# Patient Record
Sex: Male | Born: 1995
Health system: Southern US, Community
[De-identification: ages and names within clinical notes are randomized; demographics above are authoritative.]

## PROBLEM LIST (undated history)

## (undated) DIAGNOSIS — F1910 Other psychoactive substance abuse, uncomplicated: Secondary | ICD-10-CM

## (undated) DIAGNOSIS — Z789 Other specified health status: Secondary | ICD-10-CM

## (undated) HISTORY — PX: NO PAST SURGERIES: SHX2092

## (undated) HISTORY — PX: FACIAL FRACTURE SURGERY: SHX1570

## (undated) HISTORY — PX: RIB FRACTURE SURGERY: SHX2358

## (undated) HISTORY — PX: OTHER SURGICAL HISTORY: SHX169

---
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
## 2015-06-02 ENCOUNTER — Emergency Department (HOSPITAL_COMMUNITY)
Admission: EM | Admit: 2015-06-02 | Discharge: 2015-06-02 | Disposition: A | Discharge status: Other Acute Inpatient Hospital | Payer: 59 | Attending: Emergency Medicine | Admitting: Emergency Medicine

## 2015-06-02 ENCOUNTER — Encounter (HOSPITAL_COMMUNITY): Payer: Self-pay | Admitting: *Deleted

## 2015-06-02 DIAGNOSIS — F1210 Cannabis abuse, uncomplicated: Secondary | ICD-10-CM | POA: Diagnosis not present

## 2015-06-02 DIAGNOSIS — Z79899 Other long term (current) drug therapy: Secondary | ICD-10-CM | POA: Insufficient documentation

## 2015-06-02 DIAGNOSIS — F1310 Sedative, hypnotic or anxiolytic abuse, uncomplicated: Secondary | ICD-10-CM | POA: Insufficient documentation

## 2015-06-02 DIAGNOSIS — Z720 Tobacco use: Secondary | ICD-10-CM | POA: Insufficient documentation

## 2015-06-02 DIAGNOSIS — Z046 Encounter for general psychiatric examination, requested by authority: Secondary | ICD-10-CM | POA: Diagnosis present

## 2015-06-02 DIAGNOSIS — R45851 Suicidal ideations: Secondary | ICD-10-CM

## 2015-06-02 DIAGNOSIS — F1910 Other psychoactive substance abuse, uncomplicated: Secondary | ICD-10-CM

## 2015-06-02 LAB — RAPID URINE DRUG SCREEN, HOSP PERFORMED
AMPHETAMINES: NOT DETECTED
BARBITURATES: NOT DETECTED
Benzodiazepines: POSITIVE — AB
COCAINE: NOT DETECTED
Opiates: NOT DETECTED
TETRAHYDROCANNABINOL: POSITIVE — AB

## 2015-06-02 LAB — CBC
HCT: 44.6 % (ref 39.0–52.0)
Hemoglobin: 14.7 g/dL (ref 13.0–17.0)
MCH: 29.2 pg (ref 26.0–34.0)
MCHC: 33 g/dL (ref 30.0–36.0)
MCV: 88.5 fL (ref 78.0–100.0)
PLATELETS: 206 10*3/uL (ref 150–400)
RBC: 5.04 MIL/uL (ref 4.22–5.81)
RDW: 13 % (ref 11.5–15.5)
WBC: 8.3 10*3/uL (ref 4.0–10.5)

## 2015-06-02 LAB — ETHANOL

## 2015-06-02 LAB — COMPREHENSIVE METABOLIC PANEL
ALK PHOS: 64 U/L (ref 38–126)
ALT: 15 U/L — AB (ref 17–63)
AST: 20 U/L (ref 15–41)
Albumin: 4.4 g/dL (ref 3.5–5.0)
Anion gap: 6 (ref 5–15)
BILIRUBIN TOTAL: 0.6 mg/dL (ref 0.3–1.2)
BUN: 15 mg/dL (ref 6–20)
CO2: 25 mmol/L (ref 22–32)
CREATININE: 1.28 mg/dL — AB (ref 0.61–1.24)
Calcium: 9 mg/dL (ref 8.9–10.3)
Chloride: 108 mmol/L (ref 101–111)
GFR calc non Af Amer: >60 mL/min (ref >60)
Glucose, Bld: 103 mg/dL — ABNORMAL HIGH (ref 65–99)
POTASSIUM: 3.6 mmol/L (ref 3.5–5.1)
Sodium: 139 mmol/L (ref 135–145)
Total Protein: 7.1 g/dL (ref 6.5–8.1)

## 2015-06-02 MED ORDER — ZOLPIDEM TARTRATE 5 MG PO TABS: 5.0000 mg | ORAL_TABLET | Freq: Every evening | ORAL | Status: DC | PRN

## 2015-06-02 MED ORDER — IBUPROFEN 400 MG PO TABS: 600.0000 mg | ORAL_TABLET | Freq: Three times a day (TID) | ORAL | Status: DC | PRN

## 2015-06-02 MED ORDER — NICOTINE 21 MG/24HR TD PT24: 21.0000 mg | MEDICATED_PATCH | Freq: Every day | TRANSDERMAL | Status: DC

## 2015-06-02 MED ORDER — ACETAMINOPHEN 325 MG PO TABS: 650.0000 mg | ORAL_TABLET | ORAL | Status: DC | PRN

## 2015-06-02 MED ORDER — ONDANSETRON HCL 4 MG PO TABS: 4.0000 mg | ORAL_TABLET | Freq: Three times a day (TID) | ORAL | Status: DC | PRN
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
## 2015-06-02 NOTE — Progress Notes (Addendum)
Patient referred for psychiatric treatment at: [facility] - per intake, fax it. [facility] - per [facility], fax referral, low acuity beds open. [facility] - per intake, fax referral. [facility] - per [place], bed available but MD will review in am, fax it. [facility] - per intake, fax it. [facility] - per [facility] Sheldon, fax referral for review. [facility] - per intake, fax for review. [facility] - per intake, fax it for review.  At capacity: [facility]  CSW will continue to seek placement.  Melbourne Abts, LCSWA Disposition staff 06/02/2015 9:20 PM
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
## 2015-06-02 NOTE — ED Notes (Signed)
Patient ambulatory to and from restroom.

## 2015-06-02 NOTE — ED Notes (Signed)
Patient resting in bed at this time. Eyes closed. Even rise and fall of chest. NAD noted

## 2015-06-02 NOTE — Progress Notes (Signed)
Per [facility], patient accepted at [facility], to Dr. Dub Mikes, bed 302-1, patient to arrive tonight, 8/09.  Call report at [phone]. RN Jill Alexanders aware.  Melbourne Abts, LCSWA Disposition staff 06/02/2015 10:04 PM

## 2015-06-02 NOTE — Progress Notes (Addendum)
Patient accepted at [facility].   Melbourne Abts, LCSWA Disposition staff 06/02/2015 9:44 PM
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
## 2015-06-03 ENCOUNTER — Inpatient Hospital Stay (HOSPITAL_COMMUNITY)
Admission: EM | Admit: 2015-06-03 | Discharge: 2015-06-05 | DRG: 885 | Disposition: A | Discharge status: Home / Self Care | Payer: 59 | Source: Intra-hospital | Attending: Psychiatry | Admitting: Psychiatry

## 2015-06-03 ENCOUNTER — Encounter (HOSPITAL_COMMUNITY): Payer: Self-pay | Admitting: *Deleted

## 2015-06-03 DIAGNOSIS — F1910 Other psychoactive substance abuse, uncomplicated: Secondary | ICD-10-CM | POA: Diagnosis not present

## 2015-06-03 DIAGNOSIS — F17210 Nicotine dependence, cigarettes, uncomplicated: Secondary | ICD-10-CM | POA: Diagnosis present

## 2015-06-03 DIAGNOSIS — F329 Major depressive disorder, single episode, unspecified: Secondary | ICD-10-CM | POA: Diagnosis present

## 2015-06-03 DIAGNOSIS — F332 Major depressive disorder, recurrent severe without psychotic features: Secondary | ICD-10-CM | POA: Diagnosis present

## 2015-06-03 MED ORDER — TRAZODONE HCL 50 MG PO TABS
50.0000 mg | ORAL_TABLET | Freq: Every evening | ORAL | Status: DC | PRN
Start: 2015-06-03 — End: 2015-06-05
  Administered 2015-06-03 – 2015-06-04 (×3): 50 mg via ORAL
  Filled 2015-06-03: qty 1
  Filled 2015-06-03: qty 6
  Filled 2015-06-03 (×5): qty 1
  Filled 2015-06-03: qty 6

## 2015-06-03 MED ORDER — ALUM & MAG HYDROXIDE-SIMETH 200-200-20 MG/5ML PO SUSP
30.0000 mL | ORAL | Status: DC | PRN
Start: 2015-06-03 — End: 2015-06-05

## 2015-06-03 MED ORDER — IBUPROFEN 600 MG PO TABS
600.0000 mg | ORAL_TABLET | Freq: Four times a day (QID) | ORAL | Status: DC | PRN
  Administered 2015-06-03 – 2015-06-05 (×4): 600 mg via ORAL
  Filled 2015-06-03 (×4): qty 1

## 2015-06-03 MED ORDER — MAGNESIUM HYDROXIDE 400 MG/5ML PO SUSP: 30.0000 mL | Freq: Every day | ORAL | Status: DC | PRN

## 2015-06-03 MED ORDER — HYDROXYZINE HCL 50 MG PO TABS
50.0000 mg | ORAL_TABLET | Freq: Four times a day (QID) | ORAL | Status: DC | PRN
  Administered 2015-06-03: 50 mg via ORAL
  Filled 2015-06-03: qty 1
  Filled 2015-06-03: qty 12

## 2015-06-03 MED ORDER — NICOTINE 21 MG/24HR TD PT24
21.0000 mg | MEDICATED_PATCH | Freq: Every day | TRANSDERMAL | Status: DC
  Administered 2015-06-03 – 2015-06-05 (×3): 21 mg via TRANSDERMAL
  Filled 2015-06-03 (×5): qty 1

## 2015-06-03 MED ORDER — ACETAMINOPHEN 325 MG PO TABS
650.0000 mg | ORAL_TABLET | Freq: Four times a day (QID) | ORAL | Status: DC | PRN
  Administered 2015-06-03 – 2015-06-05 (×3): 650 mg via ORAL
  Filled 2015-06-03 (×3): qty 2
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
## 2015-06-03 NOTE — ED Notes (Signed)
Patient discharged to [facility] 

## 2015-06-03 NOTE — Tx Team (Signed)
Initial Interdisciplinary Treatment Plan   PATIENT STRESSORS: Legal issue Marital or family conflict Medication change or noncompliance Substance abuse   PATIENT STRENGTHS: Ability for insight Average or above average intelligence Communication skills Supportive family/friends Work skills   PROBLEM LIST: Problem List/Patient Goals Date to be addressed Date deferred Reason deferred Estimated date of resolution  "I was talking about ending it all, everybody has their time" 06-03-15     Depression 06-03-15     "I smoke weed" 06-03-15     "buy klonopin off the street" 06-03-15                                    DISCHARGE CRITERIA:  Improved stabilization in mood, thinking, and/or behavior Motivation to continue treatment in a less acute level of care Need for constant or close observation no longer present Reduction of life-threatening or endangering symptoms to within safe limits Verbal commitment to aftercare and medication compliance  PRELIMINARY DISCHARGE PLAN: Attend 12-step recovery group Outpatient therapy Participate in family therapy Return to previous work or school arrangements  PATIENT/FAMIILY INVOLVEMENT: This treatment plan has been presented to and reviewed with the patient, Stephen Rhodes, and/or family member.  The patient and family have been given the opportunity to ask questions and make suggestions.  Mickeal Needy 06/03/2015, 3:36 AM
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
## 2015-06-03 NOTE — Progress Notes (Signed)
D:  Per pt self inventory pt reports sleeping fair, appetite fair, energy level normal, ability to pay attention good, rates depression at a 3 out of 10, hopelessness at a 0 out of 10, anxiety at a 7 out of 10, denies SI/HI/AVH, goal today: "Just chill and work on being happy and not be a dickhead, work on being happy and nice", depressed/flat during interaction.     A:  Emotional support provided, Encouraged pt to continue with treatment plan and attend all group activities, q15 min checks maintained for safety.  R:  Pt is receptive, interacts appropriately, going to groups, pleasant and cooperative with staff and other patients on the unit.
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
## 2015-06-03 NOTE — Progress Notes (Signed)
Recreation Therapy Notes  Date: 08.10.16 Time: 930 am Location: 300 Hall Group Room  Group Topic: Stress Management  Goal Area(s) Addresses:  Patient will verbalize importance of using healthy stress management.  Patient will identify positive emotions associated with healthy stress management.   Behavioral Response:  Engaged  Intervention: Stress Management  Activity : Progressive Muscle Relaxation. LRT will introduce and instruct patients on the stress management technique of progressive muscle relaxation. Patients were asked to follow a long with a script read a loud by LRT to participate in the stress management technique of progressive muscle relaxation.  Education: Stress Management, Discharge Planning.   Education Outcome: Acknowledges edcuation/In group clarification offered/Needs additional education  Clinical Observations/Feedback: Patient attended the group.    Zack Crager, LRT/CTRS         Maxmillian Carsey A 06/03/2015 3:43 PM 

## 2015-06-03 NOTE — BHH Group Notes (Signed)
[facility] LCSW Aftercare Discharge Planning Group Note   06/03/2015 8:47 AM  Participation Quality:  DID NOT ATTEND. Chose to remain in room.  Smart, [employer]

## 2015-06-03 NOTE — BHH Group Notes (Signed)
[facility] LCSW Group Therapy  06/03/2015 1:15 PM  Type of Therapy:  Group Therapy  Participation Level:  Did Not Attend-pt chose to rest in room.   Summary of Progress/Problems: Emotion Regulation: This group focused on both positive and negative emotion identification and allowed group members to process ways to identify feelings, regulate negative emotions, and find healthy ways to manage internal/external emotions. Group members were asked to reflect on a time when their reaction to an emotion led to a negative outcome and explored how alternative responses using emotion regulation would have benefited them. Group members were also asked to discuss a time when emotion regulation was utilized when a negative emotion was experienced.   Smart, Artemio Dobie LCSWA  06/03/2015, 1:15 PM

## 2015-06-03 NOTE — Progress Notes (Signed)
Pt attended NA speaker meeting and was engaged and attentive.  

## 2015-06-04 NOTE — BHH Group Notes (Signed)
[facility] LCSW Group Therapy 06/04/2015 1:15 PM Type of Therapy: Group Therapy Participation Level: Active  Participation Quality: Attentive, Sharing and Supportive  Affect: Appropriate  Cognitive: Alert and Oriented  Insight: Developing/Improving and Engaged  Engagement in Therapy: Developing/Improving and Engaged  Modes of Intervention: Activity, Clarification, Confrontation, Discussion, Education, Exploration, Limit-setting, Orientation, Problem-solving, Rapport Building, Reality Testing, Socialization and Support  Summary of Progress/Problems: Patient was attentive and engaged with speaker from [employer]. Patient was attentive to speaker while they shared their story of dealing with mental health and overcoming it. Patient expressed interest in their programs and services and received information on their agency. Patient processed ways they can relate to the speaker.   Herma Uballe, MSW, LCSWA Clinical Social Worker [facility] [phone]   
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
## 2015-06-04 NOTE — Progress Notes (Signed)
Patient attended karaoke group and participated.  

## 2015-06-04 NOTE — [facility] Group Notes (Signed)
[facility] LCSW Group Therapy  06/04/2015 11:17 AM  Type of Therapy:  Group Therapy  Participation Level:  Did Not Attend-pt in bed sleeping. Chose not to attend group.   Summary of Progress/Problems:  Finding Balance in Life. Today's group focused on defining balance in one's own words, identifying things that can knock one off balance, and exploring healthy ways to maintain balance in life. Group members were asked to provide an example of a time when they felt off balance, describe how they handled that situation,and process healthier ways to regain balance in the future. Group members were asked to share the most important tool for maintaining balance that they learned while at [facility] and how they plan to apply this method after discharge.   Smart, Stephen Rhodes LCSWA  06/04/2015, 11:17 AM

## 2015-06-04 NOTE — Progress Notes (Signed)
D:  Per pt self inventory pt reports sleeping good, appetite fair, energy level normal, ability to pay attention good, rates depression at a 3 out of 10, hopelessness at a 4 out of 10, anxiety at a 5 out of 10, denies SI/HI/AVH, flat/anxious during interaction, goal today: "Just chill and get the hell out of here and keep my head up, stay positive"    A:  Emotional support provided, Encouraged pt to continue with treatment plan and attend all group activities, q15 min checks maintained for safety.  R:  Pt is receptive, going to groups, interacts appropriately, pleasant and cooperative with staff and other patients on the unit.
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
## 2015-06-04 NOTE — [facility] Suicide Risk Assessment (Signed)
[facility] INPATIENT:  Family/Significant Other Suicide Prevention Education  Suicide Prevention Education:  Contact Attempts: Taevyn Hausen (pt's mother) [phone] has been identified by the patient as the family member/significant other with whom the patient will be residing, and identified as the person(s) who will aid the patient in the event of a mental health crisis.  With written consent from the patient, two attempts were made to provide suicide prevention education, prior to and/or following the patient's discharge.  We were unsuccessful in providing suicide prevention education.  A suicide education pamphlet was given to the patient to share with family/significant other.  Date and time of first attempt: 8/1//16 at 10:40AM. Voicemail left requesting call back at her earliest convenience.   Smart, Tinlee Navarrette LCSWA  06/04/2015, 10:41 AM   SPE completed with Marisue Ivan. She voiced no concerns regarding pt returning home tomorrow and was informed of pt's aftercare plan. No guns/firearms in home.   Trula Slade, LCSWA Clinical Social Worker 06/04/2015 11:27 AM

## 2015-06-05 DIAGNOSIS — F332 Major depressive disorder, recurrent severe without psychotic features: Secondary | ICD-10-CM | POA: Insufficient documentation

## 2015-06-05 MED ORDER — HYDROXYZINE HCL 50 MG PO TABS: 50.0000 mg | ORAL_TABLET | Freq: Four times a day (QID) | ORAL | Status: AC | PRN

## 2015-06-05 MED ORDER — NICOTINE 21 MG/24HR TD PT24: 21.0000 mg | MEDICATED_PATCH | Freq: Every day | TRANSDERMAL | Status: AC

## 2015-06-05 MED ORDER — TRAZODONE HCL 50 MG PO TABS: 50.0000 mg | ORAL_TABLET | Freq: Every evening | ORAL | Status: AC | PRN

## 2015-06-05 NOTE — Progress Notes (Signed)
Pt came back from being outside and reported that he grabbed the basket playing basketball and hurt his rt wrist. No redness or swelling noted. Pt moving wrist back and forth. Pt had been given ibuprofen and tylenol prior to going outside. Gave ice pack and reported to NP. Safety maintained.

## 2015-06-05 NOTE — Progress Notes (Signed)
Pt reports he is doing well this evening.  He had a visit with his mother tonight.  He says that he plans to move in with her after his father leaves the home.  He says he will stay with friends until that time.  He also states that he and is sister are going together to some of the recruitment centers to see which branch of the military they are interested in as his sister wants to join the [employer] also.  He seems to determined to change the direction of his life and Clinical research associate commended him for his motivation.  He reports he attended groups today and found them helpful.  He denies SI/HI/AVH.  He makes his needs known to staff.  He is med compliant.  He denies any withdrawal symptoms.  Pt is pleasant and cooperative.  Support and encouragement offered.  Safety maintained with q15 minute checks.

## 2015-06-05 NOTE — Progress Notes (Signed)
  [facility] Adult Case Management Discharge Plan :  Will you be returning to the same living situation after discharge:  Yes,  home with mother At discharge, do you have transportation home?: Yes,  pt's mother coming after lunch to pick up pt. Do you have the ability to pay for your medications: Yes,  [facility]  Release of information consent forms completed and submitted to Medical Records by CSW.  Patient to Follow up at: Follow-up Information    Follow up with [facility]  On 06/20/2015.   Why:  Appt on this date with Dr. Michae Kava at [facility] for medication management.   Contact information:   [address] [place], [place] [address] Phone: [phone] Fax: [phone]      Follow up with [facility]  On 06/30/2015.   Why:  Arrive at 9:15AM to complete New Patient Packet. Therapy appt scheduled for 10:00AM. Please call within 48 hours of appt to cancel or reschedule.    Contact information:   [address] [place], [place] [address] Phone: [phone] Fax: [phone]      Patient denies SI/HI: Yes,  during group/self report.     Safety Planning and Suicide Prevention discussed: Yes,  SPE completed with pt's mother. SPI pamphlet provided to pt.   Have you used any form of tobacco in the last 30 days? (Cigarettes, Smokeless Tobacco, Cigars, and/or Pipes): Yes  Has patient been referred to the Quitline?: Yes, faxed on 06/05/15  Smart, Shaughn Thomley LCSWA  06/05/2015, 9:56 AM

## 2015-06-05 NOTE — Progress Notes (Signed)
Discharge note:  Patient discharged home per MD order.  Patient received all personal belongings, prescriptions and medication samples.  Reviewed discharge instructions, along with follow up appointments.  Patient indicated understanding.  He denies SI/HI/AVH.  Patient left ambulatory with his mother.

## 2015-06-05 NOTE — Progress Notes (Signed)
Recreation Therapy Notes  Date: 08.12.16 Time: 9:30 am Location: [address] Group Room  Group Topic: Stress Management  Goal Area(s) Addresses:  Patient will verbalize importance of using healthy stress management.  Patient will identify positive emotions associated with healthy stress management.   Intervention: Stress Management  Activity :  Guided Training and development officer.  LRT will introduce and educate the patients on the stress management technique of guided imagery.  A script was used to deliver the technique to the patients.  Patients were asked to follow the script read aloud by the LRT to engage in the stress management technique.  Education:  Stress Management, Discharge Planning.   Education Outcome: Acknowledges edcuation/In group clarification offered/Needs additional education  Clinical Observations/Feedback: Patient did not attend group.   Caroll Rancher, LRT/CTRS         Caroll Rancher A 06/05/2015 4:01 PM
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
## 2015-06-05 NOTE — [facility] Group Notes (Signed)
[facility] LCSW Aftercare Discharge Planning Group Note   06/05/2015 9:52 AM  Participation Quality:  Appropriate   Mood/Affect:  Appropriate  Depression Rating:  0  Anxiety Rating:  0  Thoughts of Suicide:  No Will you contract for safety?   NA  Current AVH:  No  Plan for Discharge/Comments:  Pt reports that he had a good visit with his mother last night. Pt ready to d/c today. No withdrawal sx and pt aware of therapy/med management appts made through [facility]. Pt to return home with his family and plans to join the [employer] "as soon as I can."   [employer]: mother coming after lunch today.   Supports: mother, friends, sister   Smart, Lebron Quam
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
## 2015-06-05 NOTE — [facility] Group Notes (Signed)
[facility] Group Notes:  (Nursing/MHT/Case Management/Adjunct)  Date:  06/04/2015 LATE ENTRY  Time:  0900 Type of Therapy:  Nurse Education  : the group is focused on helping patients identify unhealthy behaviors.  Participation Level:  Active  Participation Quality:  Appropriate  Affect:  Anxious  Cognitive:  Alert  Insight:  Limited  Engagement in Group:  Engaged  Modes of Intervention:  Education  Summary of Progress/Problems:  Stephen Rhodes 06/05/2015, 10:28 AM
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
## 2015-06-05 NOTE — [facility] Group Notes (Signed)
[facility] Group Notes:  (Nursing/MHT/Case Management/Adjunct)  Date: 06/04/2015  LATE ENTRY   Time: 0900   Type of Therapy:  Nurse Education : the group is focused on helping patients identify unhealthy behaviors.  Participation Level:  Active  Participation Quality:  Appropriate  Affect:  Appropriate  Cognitive:  Alert  Insight:  Lacking  Engagement in Group:  Lacking  Modes of Intervention:  Education  Summary of Progress/Problems:  Stephen Rhodes 06/05/2015, 10:14 AM

## 2015-06-30 ENCOUNTER — Ambulatory Visit (HOSPITAL_COMMUNITY): Payer: Self-pay | Admitting: Psychology

## 2015-07-21 ENCOUNTER — Ambulatory Visit (HOSPITAL_COMMUNITY): Payer: Self-pay | Admitting: Psychiatry

## 2015-10-28 DIAGNOSIS — F1920 Other psychoactive substance dependence, uncomplicated: Secondary | ICD-10-CM | POA: Diagnosis not present

## 2015-10-28 DIAGNOSIS — F1399 Sedative, hypnotic or anxiolytic use, unspecified with unspecified sedative, hypnotic or anxiolytic-induced disorder: Secondary | ICD-10-CM | POA: Diagnosis not present

## 2015-10-28 DIAGNOSIS — F1299 Cannabis use, unspecified with unspecified cannabis-induced disorder: Secondary | ICD-10-CM | POA: Diagnosis not present

## 2015-10-28 DIAGNOSIS — F329 Major depressive disorder, single episode, unspecified: Secondary | ICD-10-CM | POA: Diagnosis not present

## 2015-10-28 DIAGNOSIS — F1599 Other stimulant use, unspecified with unspecified stimulant-induced disorder: Secondary | ICD-10-CM | POA: Diagnosis not present

## 2015-10-29 DIAGNOSIS — F1599 Other stimulant use, unspecified with unspecified stimulant-induced disorder: Secondary | ICD-10-CM | POA: Diagnosis not present

## 2015-10-29 DIAGNOSIS — F1399 Sedative, hypnotic or anxiolytic use, unspecified with unspecified sedative, hypnotic or anxiolytic-induced disorder: Secondary | ICD-10-CM | POA: Diagnosis not present

## 2015-10-29 DIAGNOSIS — F329 Major depressive disorder, single episode, unspecified: Secondary | ICD-10-CM | POA: Diagnosis not present

## 2015-10-29 DIAGNOSIS — F1920 Other psychoactive substance dependence, uncomplicated: Secondary | ICD-10-CM | POA: Diagnosis not present

## 2015-10-29 DIAGNOSIS — F1299 Cannabis use, unspecified with unspecified cannabis-induced disorder: Secondary | ICD-10-CM | POA: Diagnosis not present

## 2015-11-03 DIAGNOSIS — F1599 Other stimulant use, unspecified with unspecified stimulant-induced disorder: Secondary | ICD-10-CM | POA: Diagnosis not present

## 2015-11-03 DIAGNOSIS — F329 Major depressive disorder, single episode, unspecified: Secondary | ICD-10-CM | POA: Diagnosis not present

## 2015-11-03 DIAGNOSIS — F1299 Cannabis use, unspecified with unspecified cannabis-induced disorder: Secondary | ICD-10-CM | POA: Diagnosis not present

## 2015-11-03 DIAGNOSIS — F1399 Sedative, hypnotic or anxiolytic use, unspecified with unspecified sedative, hypnotic or anxiolytic-induced disorder: Secondary | ICD-10-CM | POA: Diagnosis not present

## 2015-11-03 DIAGNOSIS — F1920 Other psychoactive substance dependence, uncomplicated: Secondary | ICD-10-CM | POA: Diagnosis not present

## 2015-11-05 DIAGNOSIS — F1399 Sedative, hypnotic or anxiolytic use, unspecified with unspecified sedative, hypnotic or anxiolytic-induced disorder: Secondary | ICD-10-CM | POA: Diagnosis not present

## 2015-11-05 DIAGNOSIS — F1920 Other psychoactive substance dependence, uncomplicated: Secondary | ICD-10-CM | POA: Diagnosis not present

## 2015-11-05 DIAGNOSIS — F1599 Other stimulant use, unspecified with unspecified stimulant-induced disorder: Secondary | ICD-10-CM | POA: Diagnosis not present

## 2015-11-05 DIAGNOSIS — F1299 Cannabis use, unspecified with unspecified cannabis-induced disorder: Secondary | ICD-10-CM | POA: Diagnosis not present

## 2015-11-05 DIAGNOSIS — F329 Major depressive disorder, single episode, unspecified: Secondary | ICD-10-CM | POA: Diagnosis not present

## 2015-11-06 DIAGNOSIS — F1399 Sedative, hypnotic or anxiolytic use, unspecified with unspecified sedative, hypnotic or anxiolytic-induced disorder: Secondary | ICD-10-CM | POA: Diagnosis not present

## 2015-11-07 DIAGNOSIS — F1399 Sedative, hypnotic or anxiolytic use, unspecified with unspecified sedative, hypnotic or anxiolytic-induced disorder: Secondary | ICD-10-CM | POA: Diagnosis not present

## 2015-11-08 DIAGNOSIS — F1399 Sedative, hypnotic or anxiolytic use, unspecified with unspecified sedative, hypnotic or anxiolytic-induced disorder: Secondary | ICD-10-CM | POA: Diagnosis not present

## 2015-11-09 DIAGNOSIS — F1399 Sedative, hypnotic or anxiolytic use, unspecified with unspecified sedative, hypnotic or anxiolytic-induced disorder: Secondary | ICD-10-CM | POA: Diagnosis not present

## 2015-11-10 DIAGNOSIS — F1399 Sedative, hypnotic or anxiolytic use, unspecified with unspecified sedative, hypnotic or anxiolytic-induced disorder: Secondary | ICD-10-CM | POA: Diagnosis not present

## 2015-11-11 DIAGNOSIS — F1399 Sedative, hypnotic or anxiolytic use, unspecified with unspecified sedative, hypnotic or anxiolytic-induced disorder: Secondary | ICD-10-CM | POA: Diagnosis not present

## 2015-11-12 DIAGNOSIS — F1399 Sedative, hypnotic or anxiolytic use, unspecified with unspecified sedative, hypnotic or anxiolytic-induced disorder: Secondary | ICD-10-CM | POA: Diagnosis not present

## 2015-11-12 DIAGNOSIS — F1599 Other stimulant use, unspecified with unspecified stimulant-induced disorder: Secondary | ICD-10-CM | POA: Diagnosis not present

## 2015-11-12 DIAGNOSIS — F1920 Other psychoactive substance dependence, uncomplicated: Secondary | ICD-10-CM | POA: Diagnosis not present

## 2015-11-12 DIAGNOSIS — F329 Major depressive disorder, single episode, unspecified: Secondary | ICD-10-CM | POA: Diagnosis not present

## 2015-11-12 DIAGNOSIS — F1299 Cannabis use, unspecified with unspecified cannabis-induced disorder: Secondary | ICD-10-CM | POA: Diagnosis not present

## 2015-11-13 DIAGNOSIS — F1399 Sedative, hypnotic or anxiolytic use, unspecified with unspecified sedative, hypnotic or anxiolytic-induced disorder: Secondary | ICD-10-CM | POA: Diagnosis not present

## 2015-11-14 DIAGNOSIS — F1399 Sedative, hypnotic or anxiolytic use, unspecified with unspecified sedative, hypnotic or anxiolytic-induced disorder: Secondary | ICD-10-CM | POA: Diagnosis not present

## 2015-11-15 DIAGNOSIS — F1399 Sedative, hypnotic or anxiolytic use, unspecified with unspecified sedative, hypnotic or anxiolytic-induced disorder: Secondary | ICD-10-CM | POA: Diagnosis not present

## 2015-11-16 DIAGNOSIS — F1399 Sedative, hypnotic or anxiolytic use, unspecified with unspecified sedative, hypnotic or anxiolytic-induced disorder: Secondary | ICD-10-CM | POA: Diagnosis not present

## 2015-11-17 DIAGNOSIS — F329 Major depressive disorder, single episode, unspecified: Secondary | ICD-10-CM | POA: Diagnosis not present

## 2015-11-17 DIAGNOSIS — F1399 Sedative, hypnotic or anxiolytic use, unspecified with unspecified sedative, hypnotic or anxiolytic-induced disorder: Secondary | ICD-10-CM | POA: Diagnosis not present

## 2015-11-17 DIAGNOSIS — F1599 Other stimulant use, unspecified with unspecified stimulant-induced disorder: Secondary | ICD-10-CM | POA: Diagnosis not present

## 2015-11-17 DIAGNOSIS — F1920 Other psychoactive substance dependence, uncomplicated: Secondary | ICD-10-CM | POA: Diagnosis not present

## 2015-11-17 DIAGNOSIS — F1299 Cannabis use, unspecified with unspecified cannabis-induced disorder: Secondary | ICD-10-CM | POA: Diagnosis not present

## 2015-11-19 DIAGNOSIS — F1399 Sedative, hypnotic or anxiolytic use, unspecified with unspecified sedative, hypnotic or anxiolytic-induced disorder: Secondary | ICD-10-CM | POA: Diagnosis not present

## 2015-11-23 DIAGNOSIS — F1399 Sedative, hypnotic or anxiolytic use, unspecified with unspecified sedative, hypnotic or anxiolytic-induced disorder: Secondary | ICD-10-CM | POA: Diagnosis not present

## 2015-11-24 DIAGNOSIS — F1599 Other stimulant use, unspecified with unspecified stimulant-induced disorder: Secondary | ICD-10-CM | POA: Diagnosis not present

## 2015-11-24 DIAGNOSIS — F1299 Cannabis use, unspecified with unspecified cannabis-induced disorder: Secondary | ICD-10-CM | POA: Diagnosis not present

## 2015-11-24 DIAGNOSIS — F1399 Sedative, hypnotic or anxiolytic use, unspecified with unspecified sedative, hypnotic or anxiolytic-induced disorder: Secondary | ICD-10-CM | POA: Diagnosis not present

## 2015-11-24 DIAGNOSIS — F1920 Other psychoactive substance dependence, uncomplicated: Secondary | ICD-10-CM | POA: Diagnosis not present

## 2015-11-24 DIAGNOSIS — F329 Major depressive disorder, single episode, unspecified: Secondary | ICD-10-CM | POA: Diagnosis not present

## 2015-11-26 DIAGNOSIS — F1399 Sedative, hypnotic or anxiolytic use, unspecified with unspecified sedative, hypnotic or anxiolytic-induced disorder: Secondary | ICD-10-CM | POA: Diagnosis not present

## 2015-12-01 DIAGNOSIS — F1399 Sedative, hypnotic or anxiolytic use, unspecified with unspecified sedative, hypnotic or anxiolytic-induced disorder: Secondary | ICD-10-CM | POA: Diagnosis not present

## 2015-12-03 DIAGNOSIS — F1399 Sedative, hypnotic or anxiolytic use, unspecified with unspecified sedative, hypnotic or anxiolytic-induced disorder: Secondary | ICD-10-CM | POA: Diagnosis not present

## 2015-12-07 DIAGNOSIS — F1399 Sedative, hypnotic or anxiolytic use, unspecified with unspecified sedative, hypnotic or anxiolytic-induced disorder: Secondary | ICD-10-CM | POA: Diagnosis not present

## 2015-12-10 DIAGNOSIS — F1399 Sedative, hypnotic or anxiolytic use, unspecified with unspecified sedative, hypnotic or anxiolytic-induced disorder: Secondary | ICD-10-CM | POA: Diagnosis not present

## 2015-12-14 DIAGNOSIS — F1299 Cannabis use, unspecified with unspecified cannabis-induced disorder: Secondary | ICD-10-CM | POA: Diagnosis not present

## 2015-12-14 DIAGNOSIS — F1599 Other stimulant use, unspecified with unspecified stimulant-induced disorder: Secondary | ICD-10-CM | POA: Diagnosis not present

## 2015-12-14 DIAGNOSIS — F1399 Sedative, hypnotic or anxiolytic use, unspecified with unspecified sedative, hypnotic or anxiolytic-induced disorder: Secondary | ICD-10-CM | POA: Diagnosis not present

## 2015-12-14 DIAGNOSIS — F1920 Other psychoactive substance dependence, uncomplicated: Secondary | ICD-10-CM | POA: Diagnosis not present

## 2015-12-14 DIAGNOSIS — F329 Major depressive disorder, single episode, unspecified: Secondary | ICD-10-CM | POA: Diagnosis not present

## 2015-12-15 DIAGNOSIS — F1399 Sedative, hypnotic or anxiolytic use, unspecified with unspecified sedative, hypnotic or anxiolytic-induced disorder: Secondary | ICD-10-CM | POA: Diagnosis not present

## 2015-12-21 DIAGNOSIS — F1399 Sedative, hypnotic or anxiolytic use, unspecified with unspecified sedative, hypnotic or anxiolytic-induced disorder: Secondary | ICD-10-CM | POA: Diagnosis not present

## 2015-12-23 DIAGNOSIS — F1920 Other psychoactive substance dependence, uncomplicated: Secondary | ICD-10-CM | POA: Diagnosis not present

## 2015-12-23 DIAGNOSIS — F1599 Other stimulant use, unspecified with unspecified stimulant-induced disorder: Secondary | ICD-10-CM | POA: Diagnosis not present

## 2015-12-23 DIAGNOSIS — F1399 Sedative, hypnotic or anxiolytic use, unspecified with unspecified sedative, hypnotic or anxiolytic-induced disorder: Secondary | ICD-10-CM | POA: Diagnosis not present

## 2015-12-23 DIAGNOSIS — F1299 Cannabis use, unspecified with unspecified cannabis-induced disorder: Secondary | ICD-10-CM | POA: Diagnosis not present

## 2015-12-23 DIAGNOSIS — F329 Major depressive disorder, single episode, unspecified: Secondary | ICD-10-CM | POA: Diagnosis not present

## 2016-01-27 MED FILL — traZODone HCL 50 MG TABS: 50 | 30 days supply | Qty: 45 | Fill #0

## 2016-10-20 MED FILL — traZODone HCL 50 MG TABS: 50 | 30 days supply | Qty: 45 | Fill #1

## 2016-12-20 DIAGNOSIS — S39012A Strain of muscle, fascia and tendon of lower back, initial encounter: Secondary | ICD-10-CM | POA: Diagnosis not present

## 2019-06-28 DIAGNOSIS — H5213 Myopia, bilateral: Secondary | ICD-10-CM | POA: Diagnosis not present

## 2019-06-28 DIAGNOSIS — H52203 Unspecified astigmatism, bilateral: Secondary | ICD-10-CM | POA: Diagnosis not present

## 2019-10-20 DIAGNOSIS — S52124A Nondisplaced fracture of head of right radius, initial encounter for closed fracture: Secondary | ICD-10-CM | POA: Diagnosis not present

## 2019-10-30 DIAGNOSIS — S52124D Nondisplaced fracture of head of right radius, subsequent encounter for closed fracture with routine healing: Secondary | ICD-10-CM | POA: Diagnosis not present

## 2019-10-31 DIAGNOSIS — S52121A Displaced fracture of head of right radius, initial encounter for closed fracture: Secondary | ICD-10-CM | POA: Diagnosis not present

## 2019-10-31 DIAGNOSIS — M25521 Pain in right elbow: Secondary | ICD-10-CM | POA: Diagnosis not present

## 2020-07-03 DIAGNOSIS — Z1152 Encounter for screening for COVID-19: Secondary | ICD-10-CM | POA: Diagnosis not present

## 2020-07-03 DIAGNOSIS — Z03818 Encounter for observation for suspected exposure to other biological agents ruled out: Secondary | ICD-10-CM | POA: Diagnosis not present

## 2020-10-01 DIAGNOSIS — Z20822 Contact with and (suspected) exposure to covid-19: Secondary | ICD-10-CM | POA: Diagnosis not present

## 2021-03-09 DIAGNOSIS — R0981 Nasal congestion: Secondary | ICD-10-CM | POA: Diagnosis not present

## 2021-03-09 DIAGNOSIS — Z20822 Contact with and (suspected) exposure to covid-19: Secondary | ICD-10-CM | POA: Diagnosis not present

## 2021-03-09 DIAGNOSIS — R059 Cough, unspecified: Secondary | ICD-10-CM | POA: Diagnosis not present
